# Patient Record
Sex: Male | Born: 1985 | Race: White | Marital: Married | Smoking: Never smoker
Health system: Southern US, Community
[De-identification: ages and names within clinical notes are randomized; demographics above are authoritative.]

## PROBLEM LIST (undated history)

## (undated) HISTORY — PX: THYROID CYST EXCISION: SHX2511

## (undated) HISTORY — PX: GYNECOMASTIA EXCISION: SHX5170

---

## 2015-06-29 ENCOUNTER — Ambulatory Visit (INDEPENDENT_AMBULATORY_CARE_PROVIDER_SITE_OTHER): Payer: Federal, State, Local not specified - PPO | Admitting: Family Medicine

## 2015-06-29 ENCOUNTER — Encounter: Payer: Self-pay | Admitting: Family Medicine

## 2015-06-29 ENCOUNTER — Ambulatory Visit (INDEPENDENT_AMBULATORY_CARE_PROVIDER_SITE_OTHER): Payer: Federal, State, Local not specified - PPO

## 2015-06-29 VITALS — BP 124/83 | HR 75 | Ht 70.0 in | Wt 158.0 lb

## 2015-06-29 DIAGNOSIS — M7918 Myalgia, other site: Secondary | ICD-10-CM

## 2015-06-29 DIAGNOSIS — M25531 Pain in right wrist: Secondary | ICD-10-CM | POA: Diagnosis not present

## 2015-06-29 DIAGNOSIS — L719 Rosacea, unspecified: Secondary | ICD-10-CM | POA: Diagnosis not present

## 2015-06-29 DIAGNOSIS — B354 Tinea corporis: Secondary | ICD-10-CM | POA: Insufficient documentation

## 2015-06-29 DIAGNOSIS — Z23 Encounter for immunization: Secondary | ICD-10-CM

## 2015-06-29 DIAGNOSIS — M25521 Pain in right elbow: Secondary | ICD-10-CM

## 2015-06-29 DIAGNOSIS — M791 Myalgia: Secondary | ICD-10-CM

## 2015-06-29 DIAGNOSIS — M25539 Pain in unspecified wrist: Secondary | ICD-10-CM | POA: Insufficient documentation

## 2015-06-29 MED ORDER — TERBINAFINE HCL 250 MG PO TABS: 250.0000 mg | ORAL_TABLET | Freq: Every day | ORAL | Status: AC

## 2015-06-29 MED ORDER — METRONIDAZOLE 1 % EX GEL: Freq: Every day | CUTANEOUS | Status: AC

## 2015-06-29 NOTE — Patient Instructions (Signed)
Thank you for coming in today. 1) Rash Face: I think it is rosacea. Apply MetroGel daily. 2) rash back: I think it is ringworm. Take terbinafine for 2 weeks. 3) wrist pain: Likely overuse tendinitis. Do the stretching exercises we discussed for tennis elbow. 4) buttock pain: Likely hamstring insertional pain area did do the exercises we discussed. Askling hamstring protocol. [email or web address]  Return in 1 month.   Rosacea Rosacea is a long-term (chronic) condition that affects the skin of the face, including the cheeks, nose, brow, and chin. This condition can also affect the eyes. Rosacea causes blood vessels near the surface of the skin to enlarge, which results in redness. CAUSES The cause of this condition is not known. Certain triggers can make rosacea worse, including:  Hot baths.  Exercise.  Sunlight.  Very hot or cold temperatures.  Hot or spicy foods and drinks.  Drinking alcohol.  Stress.  Taking blood pressure medicine.  Long-term use of topical steroids on the face. RISK FACTORS This condition is more likely to develop in:  People who are older than 29 years of age.  Women.  People who have light-colored skin (light complexion).  People who have a family history of rosacea. SYMPTOMS  Symptoms of this condition include:  Redness of the face.  Red bumps or pimples on the face.  A red, enlarged nose.  Blushing easily.  Red lines on the skin.  Irritated or burning feeling in the eyes.  Swollen eyelids. DIAGNOSIS This condition is diagnosed with a medical history and physical exam. TREATMENT There is no cure for this condition, but treatment can help to control your symptoms. Your health care provider may recommend that you see a skin specialist (dermatologist). Treatment may include:  Antibiotic medicines that are applied to the skin or taken as a pill.  Laser treatment to improve the appearance of the skin.  Surgery. This is  rare. Your health care provider will also recommend the best way to take care of your skin. Even after your skin improves, you will likely need to continue treatment to prevent your rosacea from coming back. HOME CARE INSTRUCTIONS Skin Care Take care of your skin as told by your health care provider. You may be told to do these things:  Wash your skin gently two or more times each day.  Use mild soap.  Use a sunscreen or sunblock with SPF 30 or greater.  Use gentle cosmetics that are meant for sensitive skin.  Shave with an electric shaver instead of a blade. Lifestyle  Try to keep track of what foods trigger this condition. Avoid any triggers. These may include:  Spicy foods.  Seafood.  Cheese.  Hot liquids.  Nuts.  Chocolate.  Iodized salt.  Do not drink alcohol.  Avoid extremely cold or hot temperatures.  Try to reduce your stress. If you need help, talk with your health care provider.  When you exercise, do these things to stay cool:  Limit your sun exposure.  Use a fan.  Do shorter and more frequent intervals of exercise. General Instructions   Keep all follow-up visits as told by your health care provider. This is important.  Take over-the-counter and prescription medicines only as told by your health care provider.  If your eyelids are affected, apply warm compresses to them. Do this as told by your health care provider.  If you were prescribed an antibiotic medicine, apply or take it as told by your health care provider. Do not stop using the  antibiotic even if your condition improves. SEEK MEDICAL CARE IF:  Your symptoms get worse.  Your symptoms do not improve after two months of treatment.  You have new symptoms.  You have any changes in vision or you have problems with your eyes, such as redness or itching.  You feel depressed.  You lose your appetite.  You have trouble concentrating.   This information is not intended to replace  advice given to you by your health care provider. Make sure you discuss any questions you have with your health care provider.   Document Released: 09/01/2004 Document Revised: 04/15/2015 Document Reviewed: 10/01/2014 Elsevier Interactive Patient Education 2016 [employer]   Body Ringworm Ringworm (tinea corporis) is a fungal infection of the skin on the body. This infection is not caused by worms, but is actually caused by a fungus. Fungus normally lives on the top of your skin and can be useful. However, in the case of ringworms, the fungus grows out of control and causes a skin infection. It can involve any area of skin on the body and can spread easily from one person to another (contagious). Ringworm is a common problem for children, but it can affect adults as well. Ringworm is also often found in athletes, especially wrestlers who share equipment and mats.  CAUSES  Ringworm of the body is caused by a fungus called dermatophyte. It can spread by:  Touchingother people who are infected.  Touchinginfected pets.  Touching or sharingobjects that have been in contact with the infected person or pet (hats, combs, towels, clothing, sports equipment). SYMPTOMS   Itchy, raised red spots and bumps on the skin.  Ring-shaped rash.  Redness near the border of the rash with a clear center.  Dry and scaly skin on or around the rash. Not every person develops a ring-shaped rash. Some develop only the red, scaly patches. DIAGNOSIS  Most often, ringworm can be diagnosed by performing a skin exam. Your caregiver may choose to take a skin scraping from the affected area. The sample will be examined under the microscope to see if the fungus is present.  TREATMENT  Body ringworm may be treated with a topical antifungal cream or ointment. Sometimes, an antifungal shampoo that can be used on your body is prescribed. You may be prescribed antifungal medicines to take by mouth if your ringworm is  severe, keeps coming back, or lasts a long time.  HOME CARE INSTRUCTIONS   Only take over-the-counter or prescription medicines as directed by your caregiver.  Wash the infected area and dry it completely before applying yourcream or ointment.  When using antifungal shampoo to treat the ringworm, leave the shampoo on the body for 3-5 minutes before rinsing.   Wear loose clothing to stop clothes from rubbing and irritating the rash.  Wash or change your bed sheets every night while you have the rash.  Have your pet treated by your veterinarian if it has the same infection. To prevent ringworm:   Practice good hygiene.  Wear sandals or shoes in public places and showers.  Do not share personal items with others.  Avoid touching red patches of skin on other people.  Avoid touching pets that have bald spots or wash your hands after doing so. SEEK MEDICAL CARE IF:   Your rash continues to spread after 7 days of treatment.  Your rash is not gone in 4 weeks.  The area around your rash becomes red, warm, tender, and swollen.   This information  is not intended to replace advice given to you by your health care provider. Make sure you discuss any questions you have with your health care provider.   Document Released: 07/22/2000 Document Revised: 04/18/2012 Document Reviewed: 02/06/2012 Elsevier Interactive Patient Education 2016 [employer]   Lateral Epicondylitis With Rehab Lateral epicondylitis involves inflammation and pain around the outer portion of the elbow. The pain is caused by inflammation of the tendons in the forearm that bring back (extend) the wrist. Lateral epicondylitis is also called tennis elbow, because it is very common in tennis players. However, it may occur in any individual who extends the wrist repetitively. If lateral epicondylitis is left untreated, it may become a chronic problem. SYMPTOMS   Pain, tenderness, and inflammation on the outer (lateral)  side of the elbow.  Pain or weakness with gripping activities.  Pain that increases with wrist-twisting motions (playing tennis, using a screwdriver, opening a door or a jar).  Pain with lifting objects, including a coffee cup. CAUSES  Lateral epicondylitis is caused by inflammation of the tendons that extend the wrist. Causes of injury may include:  Repetitive stress and strain on the muscles and tendons that extend the wrist.  Sudden change in activity level or intensity.  Incorrect grip in racquet sports.  Incorrect grip size of racquet (often too large).  Incorrect hitting position or technique (usually backhand, leading with the elbow).  Using a racket that is too heavy. RISK INCREASES WITH:  Sports or occupations that require repetitive and/or strenuous forearm and wrist movements (tennis, squash, racquetball, carpentry).  Poor wrist and forearm strength and flexibility.  Failure to warm up properly before activity.  Resuming activity before healing, rehabilitation, and conditioning are complete. PREVENTION   Warm up and stretch properly before activity.  Maintain physical fitness:  Strength, flexibility, and endurance.  Cardiovascular fitness.  Wear and use properly fitted equipment.  Learn and use proper technique and have a coach correct improper technique.  Wear a tennis elbow (counterforce) brace. PROGNOSIS  The course of this condition depends on the degree of the injury. If treated properly, acute cases (symptoms lasting less than 4 weeks) are often resolved in 2 to 6 weeks. Chronic (longer lasting cases) often resolve in 3 to 6 months but may require physical therapy. RELATED COMPLICATIONS   Frequently recurring symptoms, resulting in a chronic problem. Properly treating the problem the first time decreases frequency of recurrence.  Chronic inflammation, scarring tendon degeneration, and partial tendon tear, requiring surgery.  Delayed healing or  resolution of symptoms. TREATMENT  Treatment first involves the use of ice and medicine to reduce pain and inflammation. Strengthening and stretching exercises may help reduce discomfort if performed regularly. These exercises may be performed at home if the condition is an acute injury. Chronic cases may require a referral to a physical therapist for evaluation and treatment. Your caregiver may advise a corticosteroid injection to help reduce inflammation. Rarely, surgery is needed. MEDICATION  If pain medicine is needed, nonsteroidal anti-inflammatory medicines (aspirin and ibuprofen), or other minor pain relievers (acetaminophen), are often advised.  Do not take pain medicine for 7 days before surgery.  Prescription pain relievers may be given, if your caregiver thinks they are needed. Use only as directed and only as much as you need.  Corticosteroid injections may be recommended. These injections should be reserved only for the most severe cases, because they can only be given a certain number of times. HEAT AND COLD  Cold treatment (icing) should be  applied for 10 to 15 minutes every 2 to 3 hours for inflammation and pain, and immediately after activity that aggravates your symptoms. Use ice packs or an ice massage.  Heat treatment may be used before performing stretching and strengthening activities prescribed by your caregiver, physical therapist, or athletic trainer. Use a heat pack or a warm water soak. SEEK MEDICAL CARE IF: Symptoms get worse or do not improve in 2 weeks, despite treatment. EXERCISES  RANGE OF MOTION (ROM) AND STRETCHING EXERCISES - Epicondylitis, Lateral (Tennis Elbow) These exercises may help you when beginning to rehabilitate your injury. Your symptoms may go away with or without further involvement from your physician, physical therapist, or athletic trainer. While completing these exercises, remember:   Restoring tissue flexibility helps normal motion to return  to the joints. This allows healthier, less painful movement and activity.  An effective stretch should be held for at least 30 seconds.  A stretch should never be painful. You should only feel a gentle lengthening or release in the stretched tissue. RANGE OF MOTION - Wrist Flexion, Active-Assisted  Extend your right / left elbow with your fingers pointing down.*  Gently pull the back of your hand towards you, until you feel a gentle stretch on the top of your forearm.  Hold this position for __________ seconds. Repeat __________ times. Complete this exercise __________ times per day.  *If directed by your physician, physical therapist or athletic trainer, complete this stretch with your elbow bent, rather than extended. RANGE OF MOTION - Wrist Extension, Active-Assisted  Extend your right / left elbow and turn your palm upwards.*  Gently pull your palm and fingertips back, so your wrist extends and your fingers point more toward the ground.  You should feel a gentle stretch on the inside of your forearm.  Hold this position for __________ seconds. Repeat __________ times. Complete this exercise __________ times per day. *If directed by your physician, physical therapist or athletic trainer, complete this stretch with your elbow bent, rather than extended. STRETCH - Wrist Flexion  Place the back of your right / left hand on a tabletop, leaving your elbow slightly bent. Your fingers should point away from your body.  Gently press the back of your hand down onto the table by straightening your elbow. You should feel a stretch on the top of your forearm.  Hold this position for __________ seconds. Repeat __________ times. Complete this stretch __________ times per day.  STRETCH - Wrist Extension   Place your right / left fingertips on a tabletop, leaving your elbow slightly bent. Your fingers should point backwards.  Gently press your fingers and palm down onto the table by  straightening your elbow. You should feel a stretch on the inside of your forearm.  Hold this position for __________ seconds. Repeat __________ times. Complete this stretch __________ times per day.  STRENGTHENING EXERCISES - Epicondylitis, Lateral (Tennis Elbow) These exercises may help you when beginning to rehabilitate your injury. They may resolve your symptoms with or without further involvement from your physician, physical therapist, or athletic trainer. While completing these exercises, remember:   Muscles can gain both the endurance and the strength needed for everyday activities through controlled exercises.  Complete these exercises as instructed by your physician, physical therapist or athletic trainer. Increase the resistance and repetitions only as guided.  You may experience muscle soreness or fatigue, but the pain or discomfort you are trying to eliminate should never worsen during these exercises. If this pain does  get worse, stop and make sure you are following the directions exactly. If the pain is still present after adjustments, discontinue the exercise until you can discuss the trouble with your caregiver. STRENGTH - Wrist Flexors  Sit with your right / left forearm palm-up and fully supported on a table or countertop. Your elbow should be resting below the height of your shoulder. Allow your wrist to extend over the edge of the surface.  Loosely holding a __________ weight, or a piece of rubber exercise band or tubing, slowly curl your hand up toward your forearm.  Hold this position for __________ seconds. Slowly lower the wrist back to the starting position in a controlled manner. Repeat __________ times. Complete this exercise __________ times per day.  STRENGTH - Wrist Extensors  Sit with your right / left forearm palm-down and fully supported on a table or countertop. Your elbow should be resting below the height of your shoulder. Allow your wrist to extend over the  edge of the surface.  Loosely holding a __________ weight, or a piece of rubber exercise band or tubing, slowly curl your hand up toward your forearm.  Hold this position for __________ seconds. Slowly lower the wrist back to the starting position in a controlled manner. Repeat __________ times. Complete this exercise __________ times per day.  STRENGTH - Ulnar Deviators  Stand with a ____________________ weight in your right / left hand, or sit while holding a rubber exercise band or tubing, with your healthy arm supported on a table or countertop.  Move your wrist, so that your pinkie travels toward your forearm and your thumb moves away from your forearm.  Hold this position for __________ seconds and then slowly lower the wrist back to the starting position. Repeat __________ times. Complete this exercise __________ times per day STRENGTH - Radial Deviators  Stand with a ____________________ weight in your right / left hand, or sit while holding a rubber exercise band or tubing, with your injured arm supported on a table or countertop.  Raise your hand upward in front of you or pull up on the rubber tubing.  Hold this position for __________ seconds and then slowly lower the wrist back to the starting position. Repeat __________ times. Complete this exercise __________ times per day. STRENGTH - Forearm Supinators   Sit with your right / left forearm supported on a table, keeping your elbow below shoulder height. Rest your hand over the edge, palm down.  Gently grip a hammer or a soup ladle.  Without moving your elbow, slowly turn your palm and hand upward to a "thumbs-up" position.  Hold this position for __________ seconds. Slowly return to the starting position. Repeat __________ times. Complete this exercise __________ times per day.  STRENGTH - Forearm Pronators   Sit with your right / left forearm supported on a table, keeping your elbow below shoulder height. Rest your  hand over the edge, palm up.  Gently grip a hammer or a soup ladle.  Without moving your elbow, slowly turn your palm and hand upward to a "thumbs-up" position.  Hold this position for __________ seconds. Slowly return to the starting position. Repeat __________ times. Complete this exercise __________ times per day.  STRENGTH - Grip  Grasp a tennis ball, a dense sponge, or a large, rolled sock in your hand.  Squeeze as hard as you can, without increasing any pain.  Hold this position for __________ seconds. Release your grip slowly. Repeat __________ times. Complete this exercise __________  times per day.  STRENGTH - Elbow Extensors, Isometric  Stand or sit upright, on a firm surface. Place your right / left arm so that your palm faces your stomach, and it is at the height of your waist.  Place your opposite hand on the underside of your forearm. Gently push up as your right / left arm resists. Push as hard as you can with both arms, without causing any pain or movement at your right / left elbow. Hold this stationary position for __________ seconds. Gradually release the tension in both arms. Allow your muscles to relax completely before repeating.   This information is not intended to replace advice given to you by your health care provider. Make sure you discuss any questions you have with your health care provider.   Document Released: 07/25/2005 Document Revised: 08/15/2014 Document Reviewed: 11/06/2008 Elsevier Interactive Patient Education [employer]

## 2015-06-29 NOTE — Progress Notes (Signed)
Quick Note:  Elbow and wrist xray are normal. ______ 

## 2015-06-29 NOTE — Assessment & Plan Note (Signed)
Patient is tender right at the initial tuberosity and pain is worse with resisted hamstring activity. This is likely insertional hamstring tendinitis versus bursitis in that area. Treat with the askling hamstring exercises. Recheck 1 month

## 2015-06-29 NOTE — Assessment & Plan Note (Signed)
Forearm pain is likely tendinitis versus overuse strain of the extensor tendons in the wrist and forearm. Plan for lateral epicondylitis exercises. Recheck 1 month x-ray wrist and elbow pending

## 2015-06-29 NOTE — Progress Notes (Signed)
Shane Goodman is a 29 y.o. male who [facility]  today for establish care discuss rash on face, back, right arm pain, right buttock pain. The patient is right hand dominant.  1) right wrist pain: Patient notes moderate to mild chronic diffuse soreness of the dorsal and palmar wrist and forearm. This is worse with prolonged typing or abusive mouse. He uses his mouse now and his left hand which has helped a bit.  He has not had any treatment for this problem yet. It has not interfered with his ability to really doesn't normal life. He denies any injury.  2) right buttock pain: Symptoms present for about 2 months. He denies any injury.  Pain is located at the right buttock when he sits especially when he drives his car. He denies any radiating pain weakness or numbness. He has not had any treatment. He does not keep his wallet in his right back pocket.  3) rash on back. Patient notes a several month history of circular scaly rash on his back. It is not itchy. He has not tried any treatment.  4) rash on face. Patient has a mildly erythematous rash on his face. He's tried some ketoconazole and hydrocortisone cream which helped only a tiny bit. It's been present for about 6 months and worsening recently. He denies any pain fevers chills nausea vomiting diarrhea or flulike illness. No new medications.   History reviewed. No pertinent past medical history. Past Surgical History  Procedure Laterality Date  . Gynecomastia excision    . Thyroid cyst excision     Social History  Substance Use Topics  . Smoking status: Never Smoker   . Smokeless tobacco: Not on file  . Alcohol Use: No   family history includes Cancer in his paternal grandfather; Heart disease in his maternal grandfather; Hypertension in his maternal grandfather and paternal grandfather; Stroke in his paternal grandmother.  ROS as above Medications: Current Outpatient Prescriptions   Medication Sig Dispense Refill  . metroNIDAZOLE (METROGEL) 1 % gel Apply topically daily. To face 60 g 1  . terbinafine (LAMISIL) 250 MG tablet Take 1 tablet (250 mg total) by mouth daily. 14 tablet 0   No current facility-administered medications for this visit.   No Known Allergies   Exam:  BP 124/83 mmHg  Pulse 75  Ht 5\' 10"  (1.778 m)  Wt 158 lb (71.668 kg)  BMI 22.67 kg/m2 Gen: Well NAD nontoxic appearing HEENT: EOMI,  MMM Lungs: Normal work of breathing. CTABL Heart: RRR no MRG Abd: NABS, Soft. Nondistended, Nontender Exts: Brisk capillary refill, warm and well perfused.  Skin: Erythematous scaly macular patches on back scattered. Nontender. Erythematous nonscaly macular patches on face and the malar pattern and around the eyebrows. Nontender. Right arm: Shoulder and elbow and wrist motion are intact and normal. The mid dorsal forearm is mildly tender to palpation with wrist extension. The tenderness seems to be about 3 or 4 cm proximal to the lateral epicondyle in the muscle belly. The wrist is nontender in the anatomical snuff box. Wrist strength is intact. Pulses capillary refill sensation are intact. Back: Nontender to spinal midline normal back range of motion. Buttocks is tender at the ischial tuberosity on the right. Pain is present and worse with resisted knee extension with activation of the hamstring tendon. The hamstring muscle belly is nontender with no palpable defects. Normal gait Psych: Alert and oriented normal affect speech and thought process.   X-ray wrist and  elbow right side pending  No results found for this or any previous visit (from the past 24 hour(s)). No results found.  Patient was given information for advanced directives. Please see individual assessment and plan sections.

## 2015-06-29 NOTE — Assessment & Plan Note (Signed)
Face rash likely to be rosacea versus atypical seborrheic dermatitis. Trial of metronidazole gel recheck 1 month

## 2015-06-29 NOTE — Progress Notes (Signed)
Quick Note:  Elbow and wrist xray are normal. ______

## 2015-06-29 NOTE — Assessment & Plan Note (Signed)
Rash on back likely tinea corporis. As it is extensive we'll treat with oral terbinafine tablets for 2 weeks. Recheck one month

## 2015-07-27 ENCOUNTER — Ambulatory Visit: Payer: Federal, State, Local not specified - PPO | Admitting: Family Medicine

## 2016-06-07 IMAGING — CR DG ELBOW COMPLETE 3+V*R*
4 series · 4 of 4 positions shown · non-contrast
Comparison: None.

CLINICAL DATA: Wrist pain. Elbow pain . No known injury. Initial
evaluation.

EXAM:
RIGHT ELBOW - COMPLETE 3+ VIEW

[elbow ap]
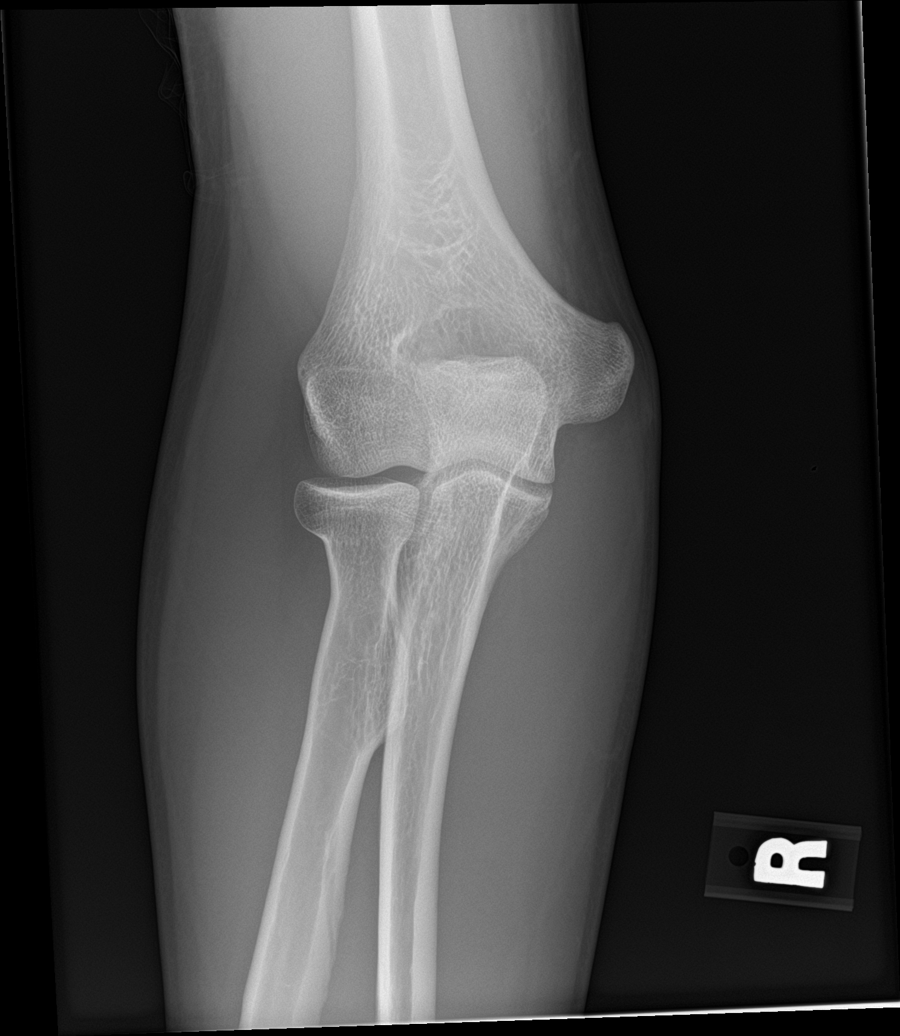

[elbow obl (1 of 2)]
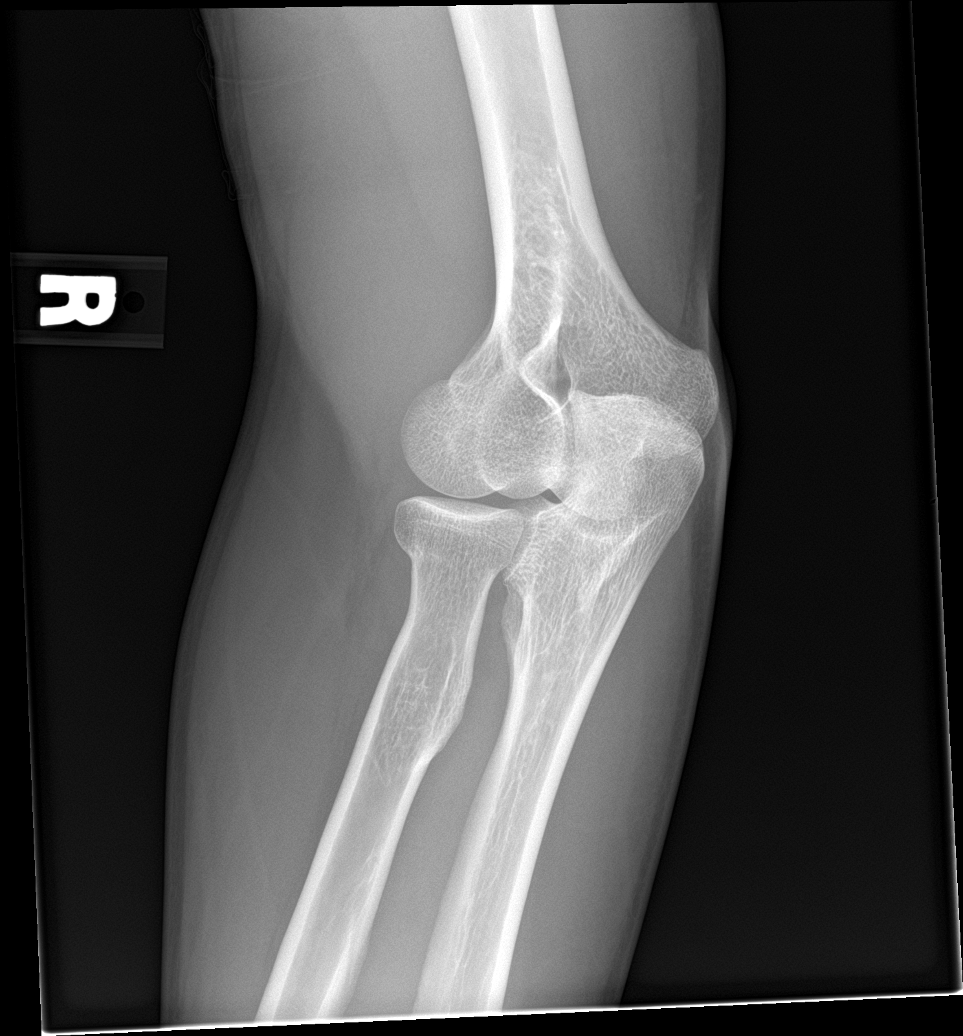

[elbow lat]
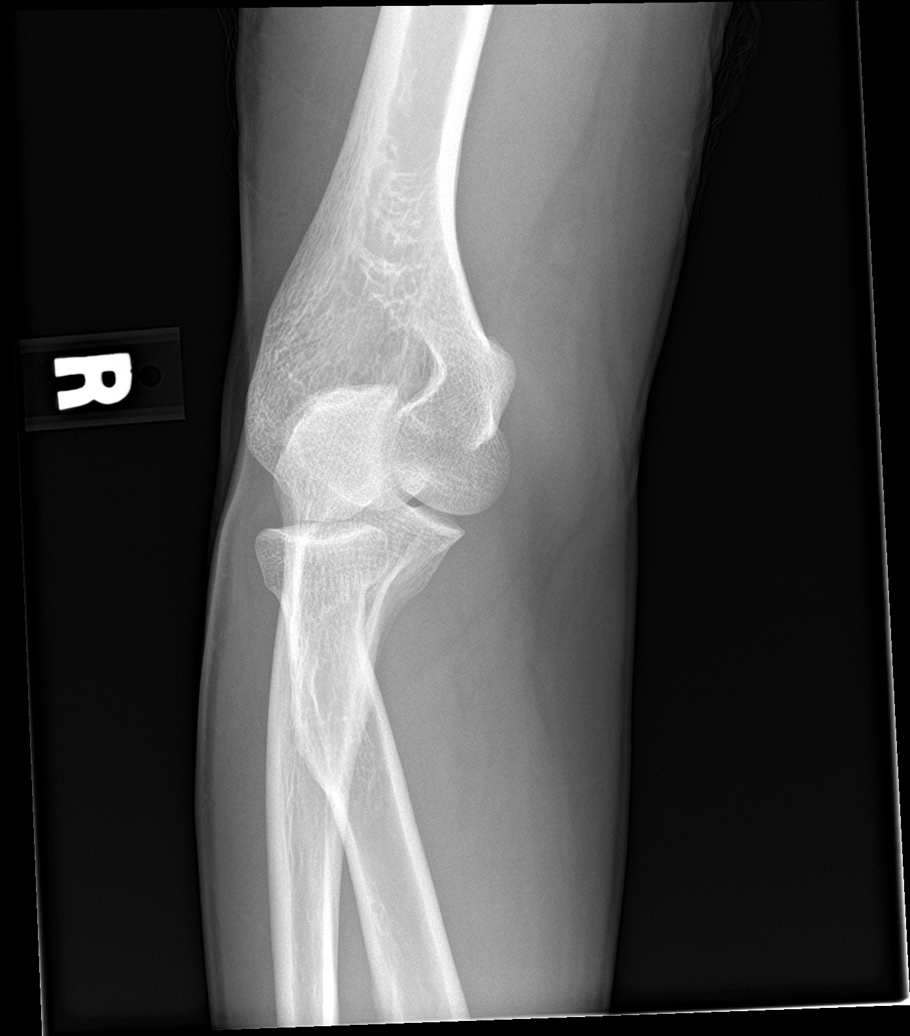

[elbow obl (2 of 2)]
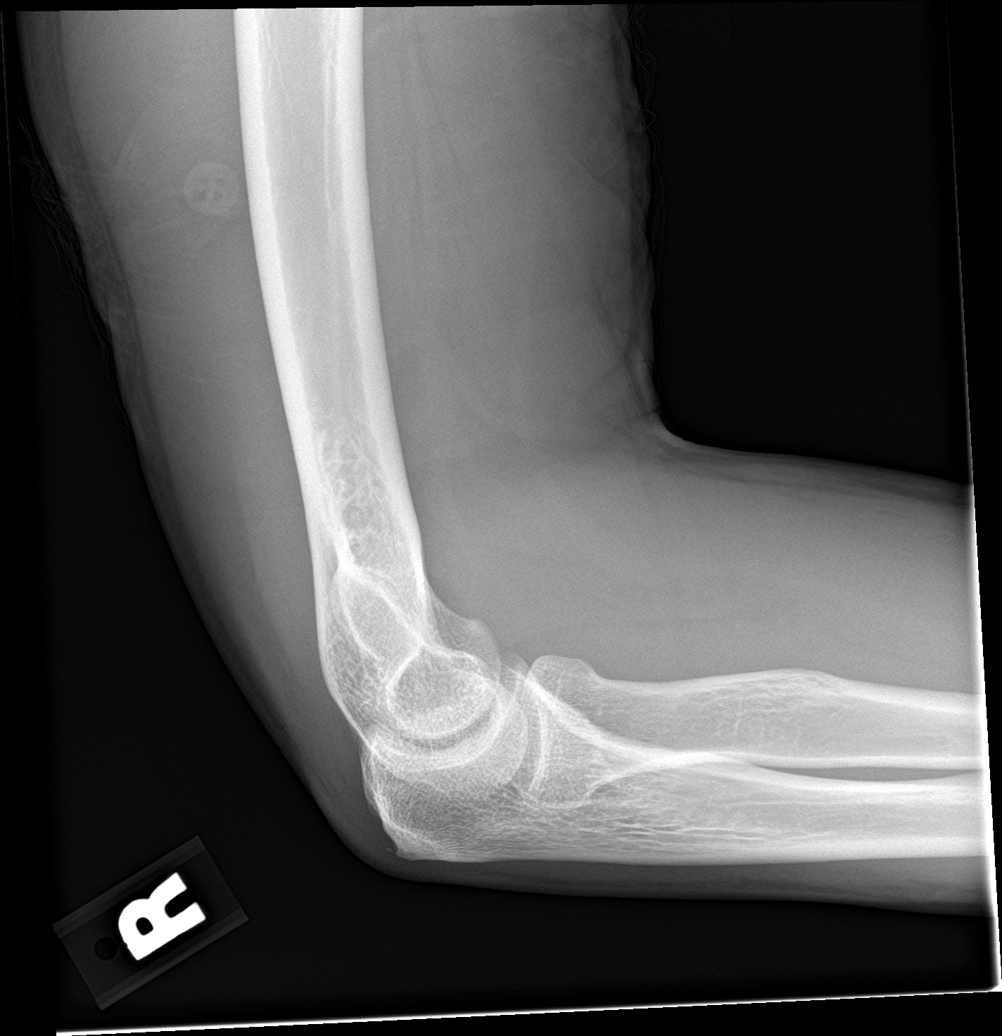

[4 of 4 positions shown; findings below may reference images not displayed]

FINDINGS: No focal or acute bony or joint abnormality identified. No evidence
of effusion.
IMPRESSION: Negative.
